# Patient Record
Sex: Male | Born: 1960 | Race: Black or African American | Hispanic: No | Marital: Single | Smoking: Current every day smoker
Health system: Southern US, Community
[De-identification: ages and names within clinical notes are randomized; demographics above are authoritative.]

---

## 2007-08-10 ENCOUNTER — Emergency Department (HOSPITAL_COMMUNITY): Admission: EM | Admit: 2007-08-10 | Discharge: 2007-08-10 | Payer: Self-pay | Admitting: Emergency Medicine
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
## 2011-04-08 ENCOUNTER — Inpatient Hospital Stay (INDEPENDENT_AMBULATORY_CARE_PROVIDER_SITE_OTHER)
Admission: RE | Admit: 2011-04-08 | Discharge: 2011-04-08 | Disposition: A | Discharge status: Home / Self Care | Payer: PRIVATE HEALTH INSURANCE | Source: Ambulatory Visit | Attending: Emergency Medicine | Admitting: Emergency Medicine

## 2011-04-08 DIAGNOSIS — L723 Sebaceous cyst: Secondary | ICD-10-CM

## 2011-04-08 DIAGNOSIS — I10 Essential (primary) hypertension: Secondary | ICD-10-CM

## 2011-04-10 ENCOUNTER — Inpatient Hospital Stay (INDEPENDENT_AMBULATORY_CARE_PROVIDER_SITE_OTHER)
Admission: RE | Admit: 2011-04-10 | Discharge: 2011-04-10 | Disposition: A | Discharge status: Home / Self Care | Payer: PRIVATE HEALTH INSURANCE | Source: Ambulatory Visit | Attending: Emergency Medicine | Admitting: Emergency Medicine

## 2011-04-10 DIAGNOSIS — L723 Sebaceous cyst: Secondary | ICD-10-CM

## 2011-04-10 DIAGNOSIS — I10 Essential (primary) hypertension: Secondary | ICD-10-CM

## 2011-05-16 ENCOUNTER — Emergency Department (HOSPITAL_COMMUNITY)
Admission: EM | Admit: 2011-05-16 | Discharge: 2011-05-16 | Disposition: A | Discharge status: Home / Self Care | Payer: PRIVATE HEALTH INSURANCE | Attending: Emergency Medicine | Admitting: Emergency Medicine

## 2011-05-16 DIAGNOSIS — IMO0002 Reserved for concepts with insufficient information to code with codable children: Secondary | ICD-10-CM | POA: Insufficient documentation

## 2011-05-16 DIAGNOSIS — S0180XA Unspecified open wound of other part of head, initial encounter: Secondary | ICD-10-CM | POA: Insufficient documentation

## 2015-01-17 ENCOUNTER — Encounter (HOSPITAL_COMMUNITY): Payer: Self-pay | Admitting: Emergency Medicine

## 2015-01-17 ENCOUNTER — Encounter (HOSPITAL_COMMUNITY): Payer: Self-pay | Admitting: *Deleted

## 2015-01-17 ENCOUNTER — Emergency Department (INDEPENDENT_AMBULATORY_CARE_PROVIDER_SITE_OTHER): Payer: Self-pay

## 2015-01-17 ENCOUNTER — Emergency Department (INDEPENDENT_AMBULATORY_CARE_PROVIDER_SITE_OTHER)
Admission: EM | Admit: 2015-01-17 | Discharge: 2015-01-17 | Disposition: A | Discharge status: Nursing Home (Includes ICF) | Payer: Self-pay | Source: Home / Self Care | Attending: Family Medicine | Admitting: Family Medicine

## 2015-01-17 ENCOUNTER — Emergency Department (HOSPITAL_COMMUNITY)
Admission: EM | Admit: 2015-01-17 | Discharge: 2015-01-17 | Disposition: A | Discharge status: Home / Self Care | Payer: Self-pay | Attending: Emergency Medicine | Admitting: Emergency Medicine

## 2015-01-17 DIAGNOSIS — R066 Hiccough: Secondary | ICD-10-CM

## 2015-01-17 DIAGNOSIS — R059 Cough, unspecified: Secondary | ICD-10-CM

## 2015-01-17 DIAGNOSIS — R634 Abnormal weight loss: Secondary | ICD-10-CM

## 2015-01-17 DIAGNOSIS — R05 Cough: Secondary | ICD-10-CM

## 2015-01-17 DIAGNOSIS — Z720 Tobacco use: Secondary | ICD-10-CM | POA: Insufficient documentation

## 2015-01-17 DIAGNOSIS — R112 Nausea with vomiting, unspecified: Secondary | ICD-10-CM

## 2015-01-17 LAB — POCT I-STAT, CHEM 8
BUN: 25 mg/dL — ABNORMAL HIGH (ref 6–23)
CREATININE: 1.5 mg/dL — AB (ref 0.50–1.35)
Calcium, Ion: 1.14 mmol/L (ref 1.12–1.23)
Chloride: 100 mmol/L (ref 96–112)
Glucose, Bld: 101 mg/dL — ABNORMAL HIGH (ref 70–99)
HCT: 44 % (ref 39.0–52.0)
HEMOGLOBIN: 15 g/dL (ref 13.0–17.0)
Potassium: 3.6 mmol/L (ref 3.5–5.1)
SODIUM: 139 mmol/L (ref 135–145)
TCO2: 24 mmol/L (ref 0–100)

## 2015-01-17 LAB — CBC WITH DIFFERENTIAL/PLATELET
BASOS ABS: 0.1 10*3/uL (ref 0.0–0.1)
Basophils Relative: 1 % (ref 0–1)
EOS ABS: 0 10*3/uL (ref 0.0–0.7)
Eosinophils Relative: 0 % (ref 0–5)
HEMATOCRIT: 39.5 % (ref 39.0–52.0)
HEMOGLOBIN: 13.5 g/dL (ref 13.0–17.0)
LYMPHS ABS: 1.1 10*3/uL (ref 0.7–4.0)
LYMPHS PCT: 15 % (ref 12–46)
MCH: 31.8 pg (ref 26.0–34.0)
MCHC: 34.2 g/dL (ref 30.0–36.0)
MCV: 93.2 fL (ref 78.0–100.0)
MONO ABS: 0.8 10*3/uL (ref 0.1–1.0)
MONOS PCT: 11 % (ref 3–12)
NEUTROS ABS: 5 10*3/uL (ref 1.7–7.7)
NEUTROS PCT: 73 % (ref 43–77)
Platelets: 125 10*3/uL — ABNORMAL LOW (ref 150–400)
RBC: 4.24 MIL/uL (ref 4.22–5.81)
RDW: 12.8 % (ref 11.5–15.5)
WBC: 7 10*3/uL (ref 4.0–10.5)

## 2015-01-17 LAB — COMPREHENSIVE METABOLIC PANEL
ALT: 16 U/L (ref 0–53)
AST: 30 U/L (ref 0–37)
Albumin: 3 g/dL — ABNORMAL LOW (ref 3.5–5.2)
Alkaline Phosphatase: 44 U/L (ref 39–117)
Anion gap: 10 (ref 5–15)
BILIRUBIN TOTAL: 1 mg/dL (ref 0.3–1.2)
BUN: 24 mg/dL — ABNORMAL HIGH (ref 6–23)
CALCIUM: 8.6 mg/dL (ref 8.4–10.5)
CHLORIDE: 105 mmol/L (ref 96–112)
CO2: 24 mmol/L (ref 19–32)
Creatinine, Ser: 1.41 mg/dL — ABNORMAL HIGH (ref 0.50–1.35)
GFR, EST AFRICAN AMERICAN: 63 mL/min — AB (ref >90)
GFR, EST NON AFRICAN AMERICAN: 55 mL/min — AB (ref >90)
GLUCOSE: 97 mg/dL (ref 70–99)
Potassium: 3.5 mmol/L (ref 3.5–5.1)
SODIUM: 139 mmol/L (ref 135–145)
Total Protein: 6 g/dL (ref 6.0–8.3)

## 2015-01-17 LAB — I-STAT TROPONIN, ED: Troponin i, poc: 0.01 ng/mL (ref 0.00–0.08)

## 2015-01-17 MED ORDER — SODIUM CHLORIDE 0.9 % IV BOLUS (SEPSIS)
1000.0000 mL | Freq: Once | INTRAVENOUS | Status: AC
  Administered 2015-01-17: 1000 mL via INTRAVENOUS

## 2015-01-17 MED ORDER — CHLORPROMAZINE HCL 25 MG PO TABS: 25.0000 mg | ORAL_TABLET | Freq: Three times a day (TID) | ORAL | Status: AC

## 2015-01-17 MED ORDER — CHLORPROMAZINE HCL 25 MG PO TABS
25.0000 mg | ORAL_TABLET | Freq: Once | ORAL | Status: AC
  Administered 2015-01-17: 25 mg via ORAL
  Filled 2015-01-17: qty 1

## 2015-01-17 NOTE — Discharge Instructions (Signed)
Hiccups A hiccup is the result of a sudden shortening of the muscle below your lungs (diaphragm). This movement of your diaphragm is then followed by the closing of your vocal cords, which causes the hiccup sound. Most people get the hiccups. Typically, hiccups last only a short amount of time. There are three types of hiccups:   Benign: last less than 48 hours.  Persistent: last more than 48 hours, but less than 1 month.  Intractable: last more than 1 month. A hiccup is a reflex. You cannot control reflexes. CAUSES  Causes of the hiccups can include:   Eating too much.  Drinking too much alcohol or fizzy drinks.  Eating too fast.  Eating or drinking hot and spicy foods or drinks.  Using certain medicines that have hiccupping as a side effect. Several medical conditions may also cause hiccups, including, but not limited to:  Stroke.  Gastroesophageal reflux.  Multiple sclerosis.  Traumatic brain injury.  Brain tumor.  Meningitis.  Having damage to the nerve that affects the diaphragm. Usually, though, hiccups have no apparent cause and are not the result of a serious medical condition. DIAGNOSIS  Tests may be performed to diagnose a possible condition associated with persistent or intractable hiccups. TREATMENT  Most cases of the hiccups need no treatment. None of the numerous home remedies have been proven to be effective. If your hiccups do require treatment, your treatment may include:  Medicine. Medicine may be given intravenously (by IV) or by mouth.  Hypnosis or acupuncture.  Surgery to the nerve that affects the diaphragm may be tried in severe cases. If your hiccups are caused by an underlying medical condition, treatment for the medical condition may be necessary.  HOME CARE INSTRUCTIONS   Eat small meals.  Limit alcohol intake to no more than 1 drink per day for nonpregnant women and 2 drinks per day for men. One drink equals 12 ounces of beer, 5 ounces  of wine, or 1 ounces of hard liquor.  Limit drinking fizzy drinks.  Eat and chew your food slowly.  Take medicines only as directed by your health care provider. SEEK MEDICAL CARE IF:   Your hiccups last for more than 48 hours.  You are given medicine, but your hiccups do not get better.  You cannot sleep or eat due to the hiccups.  You have unexpected weight loss due to the hiccups.  You have trouble breathing or swallowing.  You have a fever.  You develop severe pain in your abdomen.  You develop numbness, tingling, or weakness. Document Released: 11/22/2001 Document Revised: 01/28/2014 Document Reviewed: 11/04/2010 [employer] Patient Information 2015 [employer]. This information is not intended to replace advice given to you by your health care provider. Make sure you discuss any questions you have with your health care provider.  

## 2015-01-17 NOTE — ED Notes (Signed)
Patient c/o productive cough, nauseous, and intractable hiccups x 2 wks. Also has some inexplicable weight loss of 10 lbs in 2 weeks. Patient is in NAD.

## 2015-01-17 NOTE — ED Provider Notes (Signed)
CSN: [id number]     Arrival date & time 01/17/15  1419 History   First MD Initiated Contact with Patient 01/17/15 1434     Chief Complaint  Patient presents with  . URI   (Consider location/radiation/quality/duration/timing/severity/associated sxs/prior Treatment) HPI     54 year old male presents complaining of being sick for 2 weeks. For 2 weeks he has cough, intractable hiccups, vomiting, and 10 pound weight loss. He is a current smoker, smokes proximally 5 cigarettes a day for the past 40 years. He denies diarrhea or constipation. No fever. No personal or family history of cancers. No history of thyroid problems. No personal or family history of thyroid disorders  History reviewed. No pertinent past medical history. History reviewed. No pertinent past surgical history. No family history on file. History  Substance Use Topics  . Smoking status: Current Every Day Smoker -- 0.75 packs/day    Types: Cigarettes  . Smokeless tobacco: Not on file  . Alcohol Use: Yes     Comment: occasional    Review of Systems  Constitutional: Positive for unexpected weight change.  Respiratory: Positive for cough.        Hiccups  Gastrointestinal: Positive for nausea and vomiting. Negative for diarrhea and constipation.  All other systems reviewed and are negative.   Allergies  Review of patient's allergies indicates no known allergies.  Home Medications   Prior to Admission medications   Not on File   BP 126/82 mmHg  Pulse 83  Temp(Src) 98.3 F (36.8 C) (Oral)  Resp 16  SpO2 96% Physical Exam  Constitutional: He is oriented to person, place, and time. He appears well-developed and well-nourished. No distress.  Thin habitus  HENT:  Head: Normocephalic and atraumatic.  Eyes: Conjunctivae are normal.  Neck: Normal range of motion. Neck supple.  Cardiovascular: Normal rate, regular rhythm and normal heart sounds.   Pulmonary/Chest: Effort normal and breath sounds normal. No respiratory  distress.  Abdominal: Soft. Bowel sounds are normal. He exhibits no distension and no mass. There is no tenderness. There is no rebound and no guarding.  Lymphadenopathy:    He has no cervical adenopathy.  Neurological: He is alert and oriented to person, place, and time. Coordination normal.  Skin: Skin is warm and dry. No rash noted. He is not diaphoretic.  Psychiatric: He has a normal mood and affect. Judgment normal.  Nursing note and vitals reviewed.   ED Course  Procedures (including critical care time) Labs Review Labs Reviewed  POCT I-STAT, CHEM 8 - Abnormal; Notable for the following:    BUN 25 (*)    Creatinine, Ser 1.50 (*)    Glucose, Bld 101 (*)    All other components within normal limits    Imaging Review Dg Chest 2 View  01/17/2015   CLINICAL DATA:  Cough, except, nausea and vomiting. Symptoms for 2 weeks. Initial encounter  EXAM: CHEST  2 VIEW  COMPARISON:  None.  FINDINGS: The heart size and mediastinal contours are within normal limits. Both lungs are clear. The visualized skeletal structures are unremarkable.  IMPRESSION: No active cardiopulmonary disease.   Electronically Signed   By: Christiana PellantGretchen  Green M.D.   On: 01/17/2015 15:14   Dg Abd 2 Views  01/17/2015   CLINICAL DATA:  Persistent nausea and vomiting over the past 2 weeks, causing the patient to lose approximately 10 lb. No associated abdominal pain.  EXAM: ABDOMEN - 2 VIEW  COMPARISON:  None.  FINDINGS: Bowel gas pattern unremarkable without evidence of  obstruction or significant ileus. Moderate to large stool burden in the colon. No visible opaque urinary tract calculi. Regional skeleton intact.  IMPRESSION: No acute abdominal abnormality.   Electronically Signed   By: Hulan Saas M.D.   On: 01/17/2015 15:16     MDM   1. Unexplained weight loss   2. Cough   3. Intractable hiccups   4. Nausea and vomiting, vomiting of unspecified type    istat and XR normal.  Concern for malignancy with  diaphragmatic involvement/irritation, transferred to ED for eval     Graylon Good, PA-C 01/17/15 1536

## 2015-01-17 NOTE — ED Provider Notes (Signed)
CSN: [id number]     Arrival date & time 01/17/15  1603 History   First MD Initiated Contact with Patient 01/17/15 1903     Chief Complaint  Patient presents with  . Mass     (Consider location/radiation/quality/duration/timing/severity/associated sxs/prior Treatment) HPI Comments: Patient presents emergency department with chief complaint of cough, who comes, and weight loss. Since the ED from urgent care. Patient states that he has had cough and cold-like symptoms for the past week or so. He reports losing 10 pounds over the past week. Reports having a productive cough, but denies any fevers or chills. He states that he has felt nauseated and has been generally fatigued, but denies any chest pain, SOB, vomiting, diarrhea, or constipation. He states that he is in everyday smoker. There are no aggravating or alleviating factors. Additionally he states that he has had intermittent intractable hiccups 2 weeks.  The history is provided by the patient. No language interpreter was used.    History reviewed. No pertinent past medical history. History reviewed. No pertinent past surgical history. No family history on file. History  Substance Use Topics  . Smoking status: Current Every Day Smoker -- 0.75 packs/day    Types: Cigarettes  . Smokeless tobacco: Not on file  . Alcohol Use: Yes     Comment: occasional    Review of Systems  Constitutional: Negative for fever and chills.  Respiratory: Negative for shortness of breath.   Cardiovascular: Negative for chest pain.  Gastrointestinal: Negative for nausea, vomiting, diarrhea and constipation.  Genitourinary: Negative for dysuria.  All other systems reviewed and are negative.     Allergies  Review of patient's allergies indicates no known allergies.  Home Medications   Prior to Admission medications   Not on File   BP 140/87 mmHg  Pulse 82  Temp(Src) 98.7 F (37.1 C) (Oral)  Resp 17  Ht  (1.727 m)  Wt 145 lb (65.772  kg)  BMI 22.05 kg/m2  SpO2 97% Physical Exam  Constitutional: He is oriented to person, place, and time. He appears well-developed and well-nourished.  HENT:  Head: Normocephalic and atraumatic.  Eyes: Conjunctivae and EOM are normal. Pupils are equal, round, and reactive to light. Right eye exhibits no discharge. Left eye exhibits no discharge. No scleral icterus.  Neck: Normal range of motion. Neck supple. No JVD present.  Cardiovascular: Normal rate, regular rhythm and normal heart sounds.  Exam reveals no gallop and no friction rub.   No murmur heard. Pulmonary/Chest: Effort normal and breath sounds normal. No respiratory distress. He has no wheezes. He has no rales. He exhibits no tenderness.  Abdominal: Soft. He exhibits no distension and no mass. There is no tenderness. There is no rebound and no guarding.  No focal abdominal tenderness, no RLQ tenderness or pain at McBurney's point, no RUQ tenderness or Murphy's sign, no left-sided abdominal tenderness, no fluid wave, or signs of peritonitis   Musculoskeletal: Normal range of motion. He exhibits no edema or tenderness.  Neurological: He is alert and oriented to person, place, and time.  Skin: Skin is warm and dry.  Psychiatric: He has a normal mood and affect. His behavior is normal. Judgment and thought content normal.  Nursing note and vitals reviewed.   ED Course  Procedures (including critical care time) Results for orders placed or performed during the hospital encounter of 01/17/15  CBC with Differential/Platelet  Result Value Ref Range   WBC 7.0 4.0 - 10.5 K/uL   RBC 4.24  4.22 - 5.81 MIL/uL   Hemoglobin 13.5 13.0 - 17.0 g/dL   HCT 60.4 54.0 - 98.1 %   MCV 93.2 78.0 - 100.0 fL   MCH 31.8 26.0 - 34.0 pg   MCHC 34.2 30.0 - 36.0 g/dL   RDW 19.1 47.8 - 29.5 %   Platelets 125 (L) 150 - 400 K/uL   Neutrophils Relative % 73 43 - 77 %   Lymphocytes Relative 15 12 - 46 %   Monocytes Relative 11 3 - 12 %   Eosinophils  Relative 0 0 - 5 %   Basophils Relative 1 0 - 1 %   Neutro Abs 5.0 1.7 - 7.7 K/uL   Lymphs Abs 1.1 0.7 - 4.0 K/uL   Monocytes Absolute 0.8 0.1 - 1.0 K/uL   Eosinophils Absolute 0.0 0.0 - 0.7 K/uL   Basophils Absolute 0.1 0.0 - 0.1 K/uL   WBC Morphology ATYPICAL LYMPHOCYTES   Comprehensive metabolic panel  Result Value Ref Range   Sodium 139 135 - 145 mmol/L   Potassium 3.5 3.5 - 5.1 mmol/L   Chloride 105 96 - 112 mmol/L   CO2 24 19 - 32 mmol/L   Glucose, Bld 97 70 - 99 mg/dL   BUN 24 (H) 6 - 23 mg/dL   Creatinine, Ser 6.21 (H) 0.50 - 1.35 mg/dL   Calcium 8.6 8.4 - 30.8 mg/dL   Total Protein 6.0 6.0 - 8.3 g/dL   Albumin 3.0 (L) 3.5 - 5.2 g/dL   AST 30 0 - 37 U/L   ALT 16 0 - 53 U/L   Alkaline Phosphatase 44 39 - 117 U/L   Total Bilirubin 1.0 0.3 - 1.2 mg/dL   GFR calc non Af Amer 55 (L) >90 mL/min   GFR calc Af Amer 63 (L) >90 mL/min   Anion gap 10 5 - 15  I-stat troponin, ED  Result Value Ref Range   Troponin i, poc 0.01 0.00 - 0.08 ng/mL   Comment 3           Dg Chest 2 View  01/17/2015   CLINICAL DATA:  Cough, except, nausea and vomiting. Symptoms for 2 weeks. Initial encounter  EXAM: CHEST  2 VIEW  COMPARISON:  None.  FINDINGS: The heart size and mediastinal contours are within normal limits. Both lungs are clear. The visualized skeletal structures are unremarkable.  IMPRESSION: No active cardiopulmonary disease.   Electronically Signed   By: Christiana Pellant M.D.   On: 01/17/2015 15:14   Dg Abd 2 Views  01/17/2015   CLINICAL DATA:  Persistent nausea and vomiting over the past 2 weeks, causing the patient to lose approximately 10 lb. No associated abdominal pain.  EXAM: ABDOMEN - 2 VIEW  COMPARISON:  None.  FINDINGS: Bowel gas pattern unremarkable without evidence of obstruction or significant ileus. Moderate to large stool burden in the colon. No visible opaque urinary tract calculi. Regional skeleton intact.  IMPRESSION: No acute abdominal abnormality.   Electronically Signed    By: Hulan Saas M.D.   On: 01/17/2015 15:16     Imaging Review Dg Chest 2 View  01/17/2015   CLINICAL DATA:  Cough, except, nausea and vomiting. Symptoms for 2 weeks. Initial encounter  EXAM: CHEST  2 VIEW  COMPARISON:  None.  FINDINGS: The heart size and mediastinal contours are within normal limits. Both lungs are clear. The visualized skeletal structures are unremarkable.  IMPRESSION: No active cardiopulmonary disease.   Electronically Signed   By: Christiana Pellant  M.D.   On: 01/17/2015 15:14   Dg Abd 2 Views  01/17/2015   CLINICAL DATA:  Persistent nausea and vomiting over the past 2 weeks, causing the patient to lose approximately 10 lb. No associated abdominal pain.  EXAM: ABDOMEN - 2 VIEW  COMPARISON:  None.  FINDINGS: Bowel gas pattern unremarkable without evidence of obstruction or significant ileus. Moderate to large stool burden in the colon. No visible opaque urinary tract calculi. Regional skeleton intact.  IMPRESSION: No acute abdominal abnormality.   Electronically Signed   By: Hulan Saashomas  Lawrence M.D.   On: 01/17/2015 15:16     EKG Interpretation   Date/Time:  Friday January 17 2015 19:33:43 EDT Ventricular Rate:  87 PR Interval:  198 QRS Duration: 88 QT Interval:  367 QTC Calculation: 441 R Axis:   34 Text Interpretation:  Sinus rhythm Nonspecific IVCD Confirmed by Fayrene FearingJAMES   MD, MARK ([id number]) on 01/17/2015 9:52:31 PM      MDM   Final diagnoses:  Hiccups    Patient with cough and cold like symptoms for the past 2 weeks.  [facility]  Discussed with Dr. Fayrene FearingJames.  Will check labs and EKG.  Will give thorazine for hiccups.  Hiccups have resolved.  CBC is unremarkable, EKG negative for ischemic changes, troponin negative, creatinine is mildly elevated to 1.41, will encourage oral hydration. No further emergent workup indicated. Patient is stable and ready for discharge.  Patient discussed with Dr. Fayrene FearingJames, who agrees with the plan.  Roxy Horsemanobert Datra Clary, PA-C 01/17/15  2153  Rolland PorterMark James, MD 01/28/15 [phone]

## 2015-01-17 NOTE — ED Notes (Signed)
[facility]  For a c-t scan.  He has been ill for 5 days nausea weakness  10lb weight loss in 5 days productive cough.  He had a chest xray and labs drawn there and he was told he had a mass in his lungs.

## 2016-02-21 IMAGING — DX DG CHEST 2V
2 series · 2 of 2 positions shown · non-contrast
Comparison: None.

CLINICAL DATA: Cough, except, nausea and vomiting. Symptoms for 2
weeks. Initial encounter

EXAM:
CHEST  2 VIEW

[chest lat]
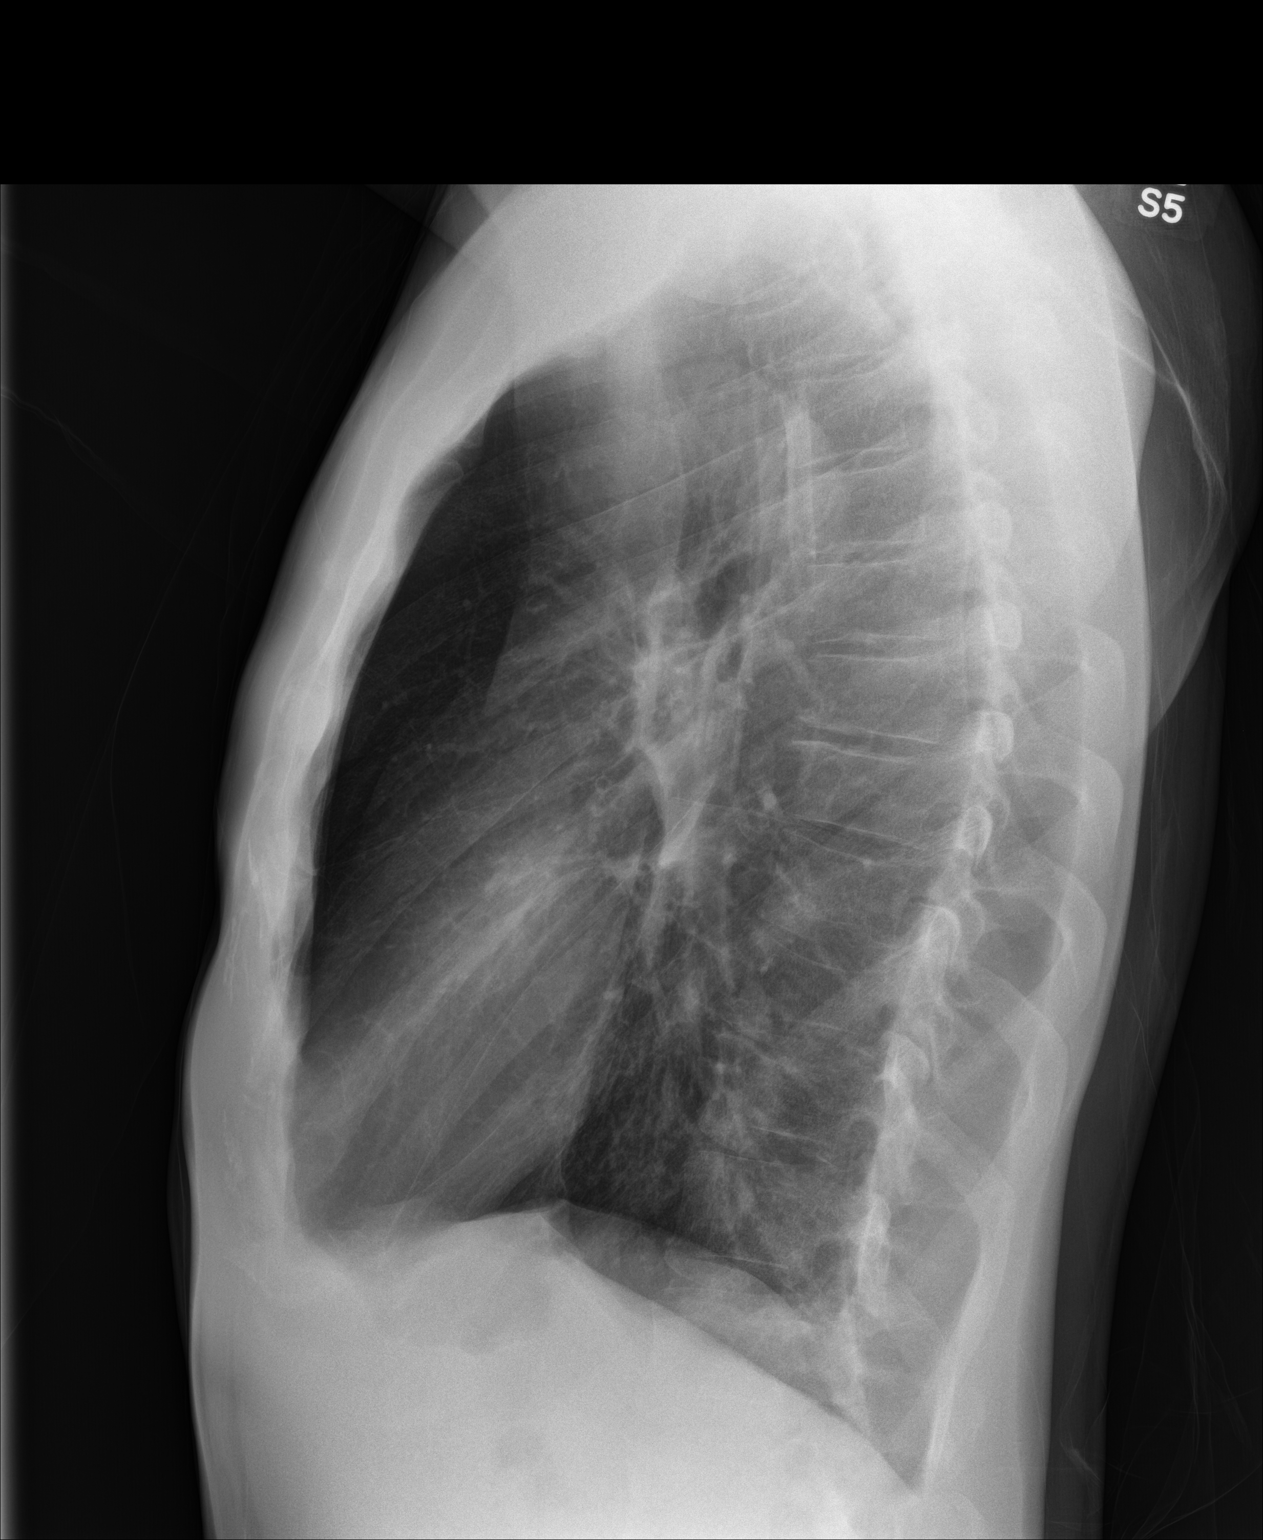

[chest pa]
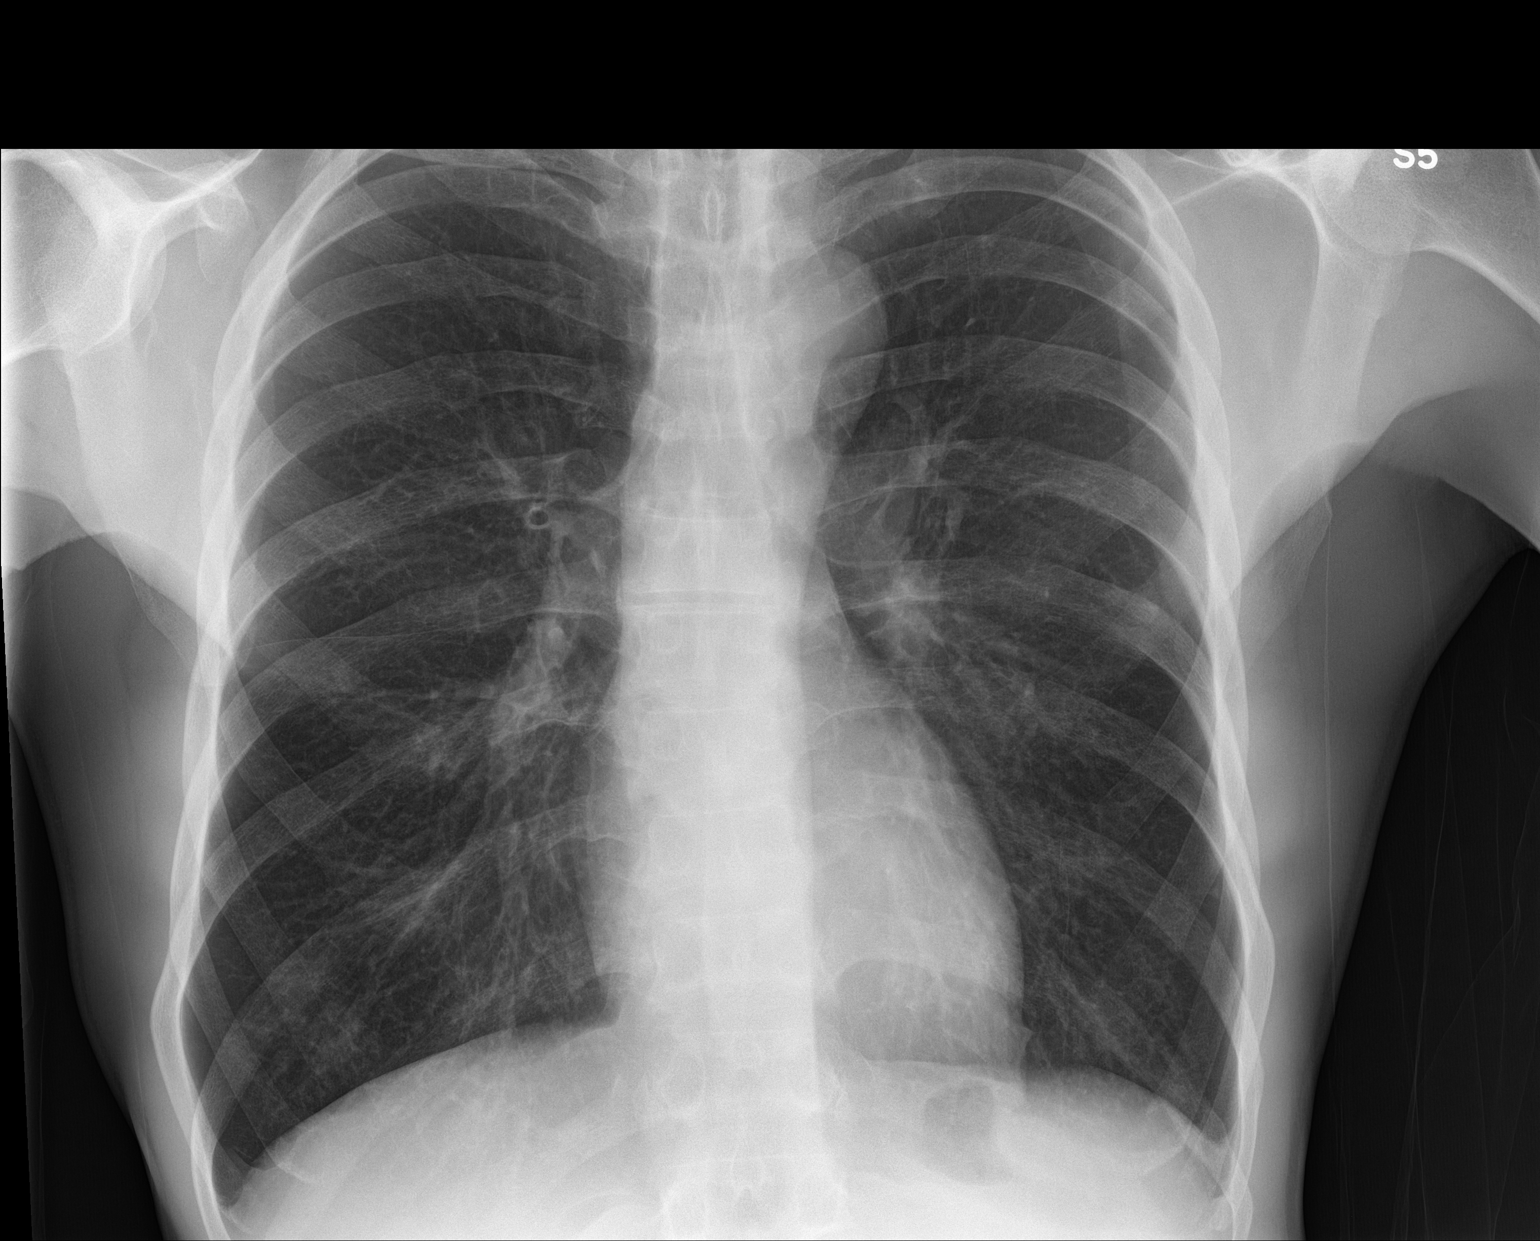

[2 of 2 positions shown; findings below may reference images not displayed]

FINDINGS: The heart size and mediastinal contours are within normal limits.
Both lungs are clear. The visualized skeletal structures are
unremarkable.
IMPRESSION: No active cardiopulmonary disease.

## 2019-03-15 ENCOUNTER — Emergency Department (HOSPITAL_COMMUNITY): Admission: EM | Admit: 2019-03-15 | Discharge: 2019-03-15 | Discharge status: Absent without leave | Payer: Self-pay
